# Patient Record
Sex: Male | Born: 2000 | Race: White | Hispanic: No | Marital: Single | Smoking: Never smoker
Health system: Southern US, Community
[De-identification: ages and names within clinical notes are randomized; demographics above are authoritative.]

## PROBLEM LIST (undated history)

## (undated) DIAGNOSIS — K311 Adult hypertrophic pyloric stenosis: Secondary | ICD-10-CM

---
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
## 2024-01-01 ENCOUNTER — Other Ambulatory Visit: Payer: Self-pay

## 2024-01-01 ENCOUNTER — Encounter: Payer: Self-pay | Admitting: Emergency Medicine

## 2024-01-01 ENCOUNTER — Emergency Department
Admission: EM | Admit: 2024-01-01 | Discharge: 2024-01-02 | Disposition: A | Discharge status: Home / Self Care | Attending: Emergency Medicine | Admitting: Emergency Medicine

## 2024-01-01 DIAGNOSIS — L03115 Cellulitis of right lower limb: Secondary | ICD-10-CM | POA: Diagnosis present

## 2024-01-01 LAB — CBC WITH DIFFERENTIAL/PLATELET
Abs Immature Granulocytes: 0.05 K/uL (ref 0.00–0.07)
Basophils Absolute: 0.1 K/uL (ref 0.0–0.1)
Basophils Relative: 1 %
Eosinophils Absolute: 0.4 K/uL (ref 0.0–0.5)
Eosinophils Relative: 3 %
HCT: 43.3 % (ref 39.0–52.0)
Hemoglobin: 14.7 g/dL (ref 13.0–17.0)
Immature Granulocytes: 0 %
Lymphocytes Relative: 17 %
Lymphs Abs: 2 K/uL (ref 0.7–4.0)
MCH: 29.8 pg (ref 26.0–34.0)
MCHC: 33.9 g/dL (ref 30.0–36.0)
MCV: 87.8 fL (ref 80.0–100.0)
Monocytes Absolute: 0.9 K/uL (ref 0.1–1.0)
Monocytes Relative: 8 %
Neutro Abs: 7.8 K/uL — ABNORMAL HIGH (ref 1.7–7.7)
Neutrophils Relative %: 71 %
Platelets: 285 K/uL (ref 150–400)
RBC: 4.93 MIL/uL (ref 4.22–5.81)
RDW: 12.4 % (ref 11.5–15.5)
WBC: 11.2 K/uL — ABNORMAL HIGH (ref 4.0–10.5)
nRBC: 0 % (ref 0.0–0.2)

## 2024-01-01 LAB — COMPREHENSIVE METABOLIC PANEL WITH GFR
ALT: 13 U/L (ref 0–44)
AST: 21 U/L (ref 15–41)
Albumin: 4.5 g/dL (ref 3.5–5.0)
Alkaline Phosphatase: 70 U/L (ref 38–126)
Anion gap: 11 (ref 5–15)
BUN: 16 mg/dL (ref 6–20)
CO2: 21 mmol/L — ABNORMAL LOW (ref 22–32)
Calcium: 9.3 mg/dL (ref 8.9–10.3)
Chloride: 103 mmol/L (ref 98–111)
Creatinine, Ser: 1.2 mg/dL (ref 0.61–1.24)
GFR, Estimated: >60 mL/min (ref >60)
Glucose, Bld: 91 mg/dL (ref 70–99)
Potassium: 4 mmol/L (ref 3.5–5.1)
Sodium: 135 mmol/L (ref 135–145)
Total Bilirubin: 0.4 mg/dL (ref 0.0–1.2)
Total Protein: 7.8 g/dL (ref 6.5–8.1)

## 2024-01-01 NOTE — ED Triage Notes (Signed)
 Patient to ED via POV for spider bite to right leg. States he is currently on po antibiotics and getting worse. Area red with orange drainage.

## 2024-01-01 NOTE — ED Provider Notes (Signed)
 [facility] Provider Note    Event Date/Time   First MD Initiated Contact with Patient 01/01/24 2346     (approximate)   History   Insect Bite   HPI  Willie Colon is a 23 y.o. male with a history of ADHD who presents with an area of redness to his right lower leg which he believes started with a spider or possible mosquito bite approximately 10 days ago.  Since then the patient developed an area of redness.  There has been drainage of pus.  He does not have any pain going up the leg.  He has no fever or chills.  He was seen at an urgent care on 9/22 and started on Bactrim.  He has been taking it for 4 days with no significant improvement.  States that the area of redness has gotten slightly worse.  I reviewed the past medical records.  The patient's most recent prior encounter in our system was a visit with family medicine in 2019 for finger swelling, diagnosed with paronychia.   Physical Exam   Triage Vital Signs: ED Triage Vitals [01/01/24 1848]  Encounter Vitals Group     BP 115/68     Girls Systolic BP Percentile      Girls Diastolic BP Percentile      Boys Systolic BP Percentile      Boys Diastolic BP Percentile      Pulse Rate 80     Resp 17     Temp 98.3 F (36.8 C)     Temp Source Oral     SpO2 96 %     Weight 130 lb (59 kg)     Height 5' 5 (1.651 m)     Head Circumference      Peak Flow      Pain Score 5     Pain Loc      Pain Education      Exclude from Growth Chart     Most recent vital signs: Vitals:   01/01/24 1848 01/01/24 2322  BP: 115/68 121/79  Pulse: 80 63  Resp: 17 16  Temp: 98.3 F (36.8 C) (!) 97.5 F (36.4 C)  SpO2: 96% 98%     General: Awake, no distress.  CV:  Good peripheral perfusion.  Resp:  Normal effort.  Abd:  No distention.  Other:  Approximately 8 cm circular area of erythema, induration, and warmth to the lateral lower right leg with a central pustule.  No active drainage.  No palpable mass  or fluctuance.   ED Results / Procedures / Treatments   Labs (all labs ordered are listed, but only abnormal results are displayed) Labs Reviewed  COMPREHENSIVE METABOLIC PANEL WITH GFR - Abnormal; Notable for the following components:      Result Value   CO2 21 (*)    All other components within normal limits  CBC WITH DIFFERENTIAL/PLATELET - Abnormal; Notable for the following components:   WBC 11.2 (*)    Neutro Abs 7.8 (*)    All other components within normal limits     EKG    RADIOLOGY    PROCEDURES:  Critical Care performed: No  Procedures   MEDICATIONS ORDERED IN ED: Medications  cephALEXin (KEFLEX) capsule 500 mg (has no administration in time range)  doxycycline (VIBRA-TABS) tablet 100 mg (has no administration in time range)     IMPRESSION / MDM / ASSESSMENT AND PLAN / ED COURSE  I reviewed the triage vital signs  and the nursing notes.  23 year old male with PMH as noted above presents with a rash with central pustule to his right lower leg.  He has no systemic symptoms.  Differential diagnosis includes, but is not limited to, cellulitis, abscess.  The abscess appears to have spontaneously drained.  There is no fluctuance currently.  The cellulitis is relatively small and limited in area although has not improved on Keflex.  CMP and CBC were obtained and are unremarkable.  CBC shows no leukocytosis.  The vital signs are normal.  The patient does not meet SIRS criteria.  There is no indication for Dalvance or for other IV antibiotics.  We will switch the antibiotics to broader coverage.  I have instructed the patient to discontinue the Bactrim and start Keflex and doxycycline.  I gave strict return precautions and he expressed understanding.  He is stable for discharge at this time.  Patient's presentation is most consistent with acute complicated illness / injury requiring diagnostic workup.     FINAL CLINICAL IMPRESSION(S) / ED DIAGNOSES   Final  diagnoses:  Cellulitis of right lower extremity     Rx / DC Orders   ED Discharge Orders          Ordered    cephALEXin (KEFLEX) 500 MG capsule  4 times daily        01/02/24 0012    doxycycline (VIBRAMYCIN) 100 MG capsule  2 times daily        01/02/24 0012             Note:  This document was prepared using Dragon voice recognition software and may include unintentional dictation errors.    Jacolyn Pae, MD 01/02/24 [phone]

## 2024-01-02 MED ORDER — DOXYCYCLINE HYCLATE 100 MG PO TABS
100.0000 mg | ORAL_TABLET | Freq: Once | ORAL | Status: AC
  Administered 2024-01-02: 100 mg via ORAL
  Filled 2024-01-02: qty 1

## 2024-01-02 MED ORDER — CEPHALEXIN 500 MG PO CAPS: 500.0000 mg | ORAL_CAPSULE | Freq: Four times a day (QID) | ORAL | 0 refills | Status: AC

## 2024-01-02 MED ORDER — DOXYCYCLINE HYCLATE 100 MG PO CAPS: 100.0000 mg | ORAL_CAPSULE | Freq: Two times a day (BID) | ORAL | 0 refills | Status: AC

## 2024-01-02 MED ORDER — CEPHALEXIN 500 MG PO CAPS
500.0000 mg | ORAL_CAPSULE | Freq: Once | ORAL | Status: AC
  Administered 2024-01-02: 500 mg via ORAL
  Filled 2024-01-02: qty 1

## 2024-01-02 NOTE — Discharge Instructions (Signed)
 Discontinue the TMP/SMX (the antibiotic you are on currently) and start taking the Keflex 4 times daily and the doxycycline twice daily for the next week.  Finish the full course.  You may apply warm compresses several times per day.  Return to the ER immediately for new, worsening, or persistent redness, spreading redness or rash, increased pain, streaking going up the leg, swelling of the bump on your leg, fever or chills, or any other new or worsening symptoms that concern you.
